# Patient Record
Sex: Male | Born: 1970 | Hispanic: No | Marital: Single
Health system: Western US, Academic
[De-identification: ages and names within clinical notes are randomized; demographics above are authoritative.]

---

## 2020-10-28 IMAGING — MR RM - BACIA (ARTICULACOES SACROILIACAS)
5 of 12 series · 19 of 48 positions shown · non-contrast
Comparison: none

[Series 3: T1 · coronal · 5.0mm · 0.74mm/px · 4 of 24 slices shown (1 of 3)]
[im 1/24]
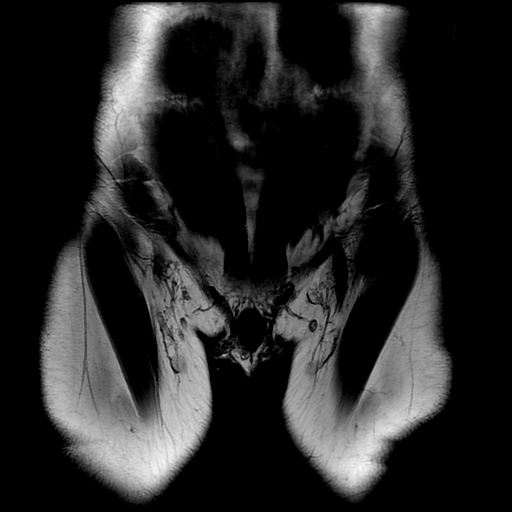
[im 8/24]
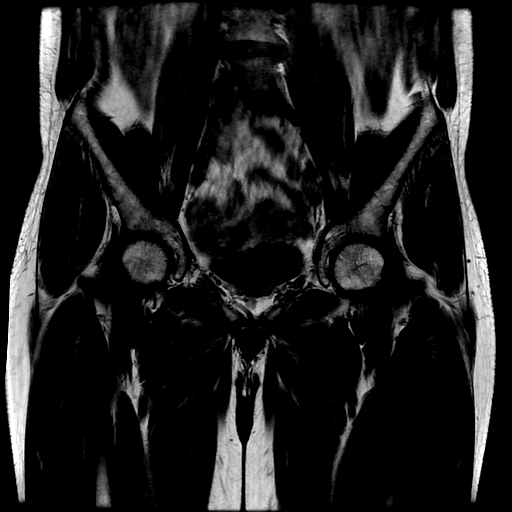
[im 16/24]
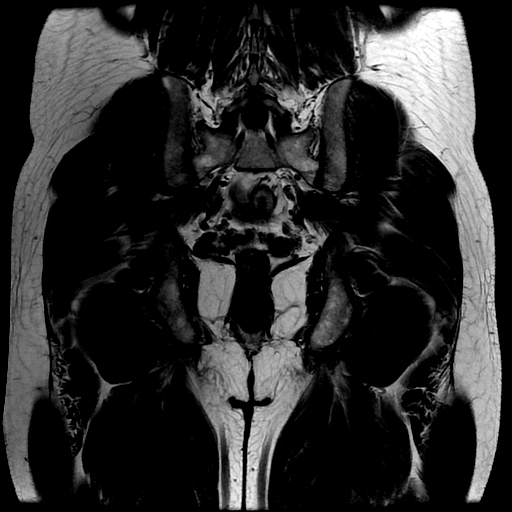
[im 24/24]
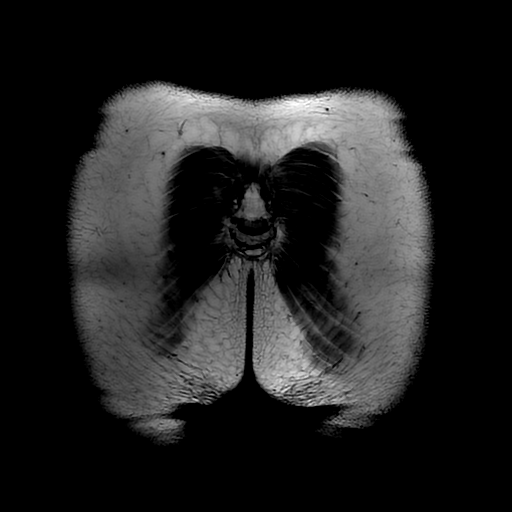

[Series 4: STIR · coronal · 5.0mm · 0.74mm/px · 4 of 24 slices shown (1 of 2)]
[im 1/24]
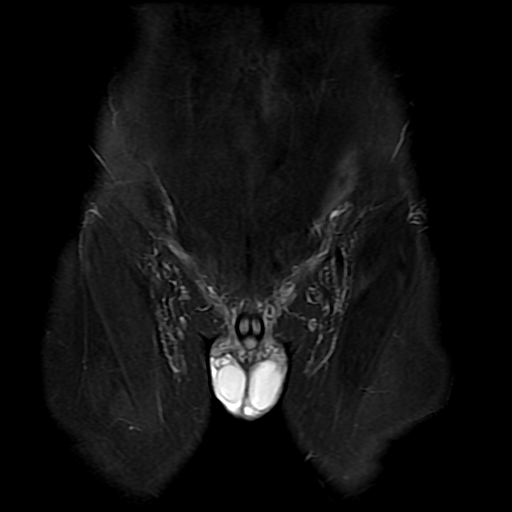
[im 8/24]
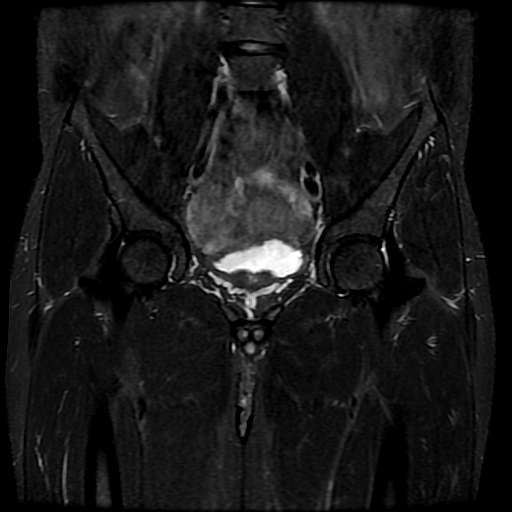
[im 16/24]
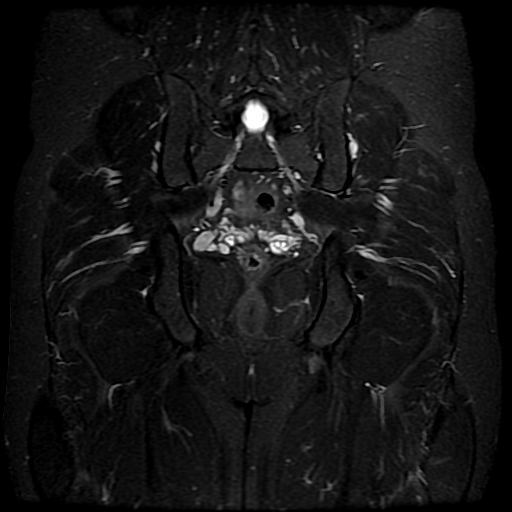
[im 24/24]
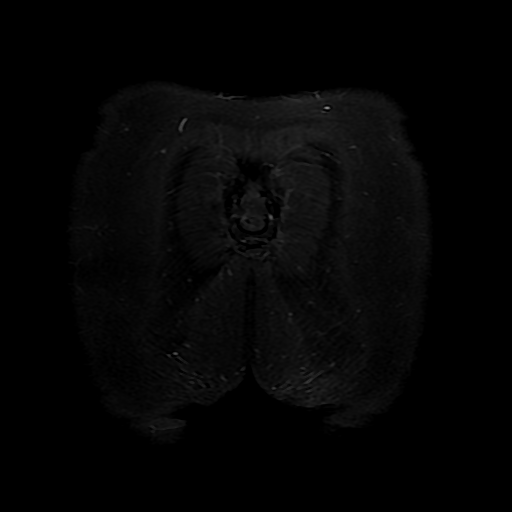

[Series 5: T1 · axial · 5.0mm · 0.72mm/px · z∈[-18,+168]mm · 5 of 32 slices shown (2 of 3)]
[im 1/32]
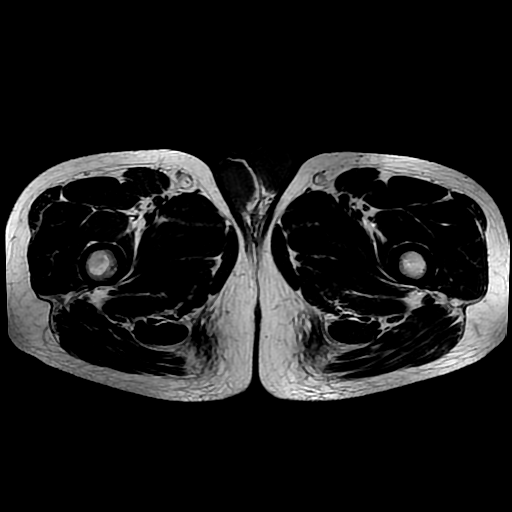
[im 8/32]
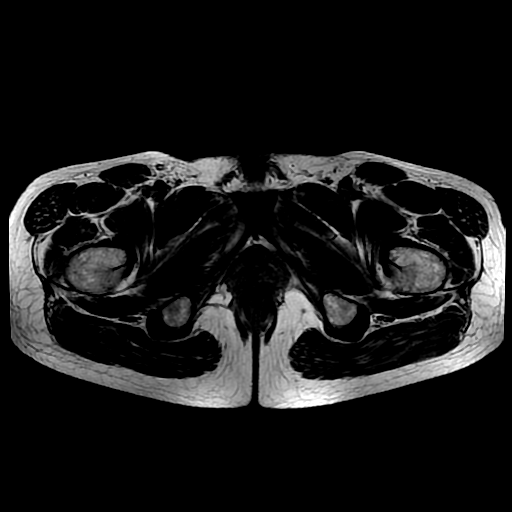
[im 16/32]
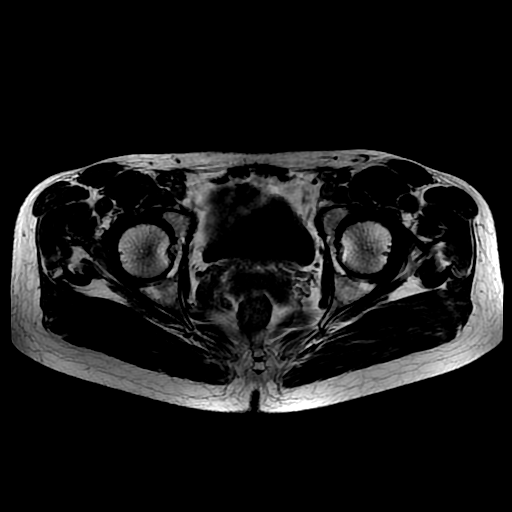
[im 24/32]
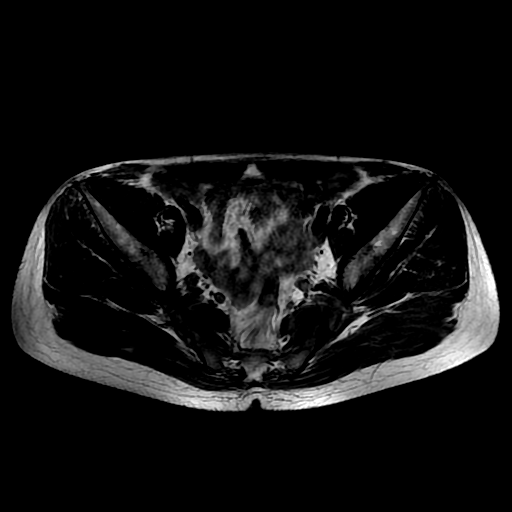
[im 32/32]
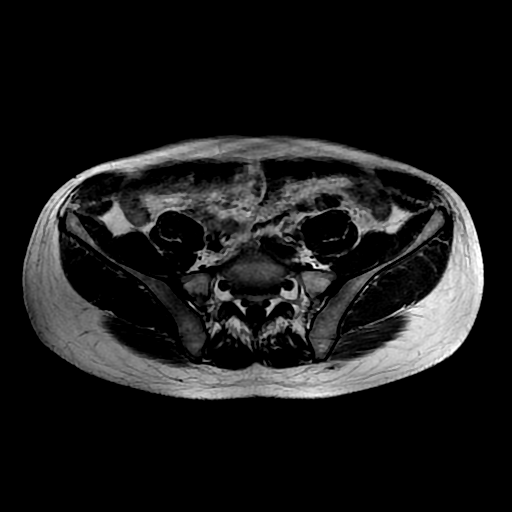

[Series 6: STIR · axial · 5.0mm · 0.72mm/px · 1 of 32 slices shown (2 of 2)]
[im 1/32]
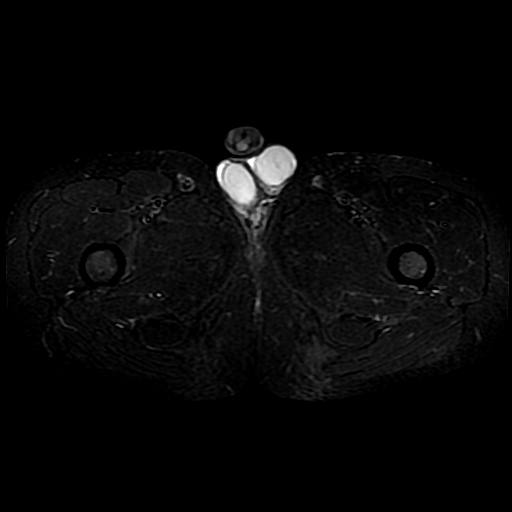

[T1 · axial · 5.0mm · 0.72mm/px · z∈[-18,+168]mm · 5 of 32 slices shown (3 of 3)]
[im 1/32]
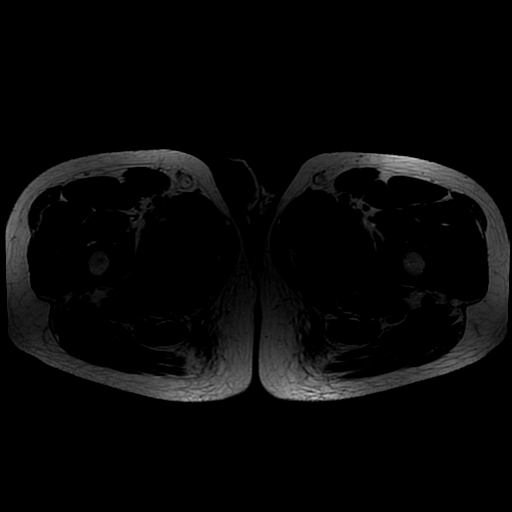
[im 8/32]
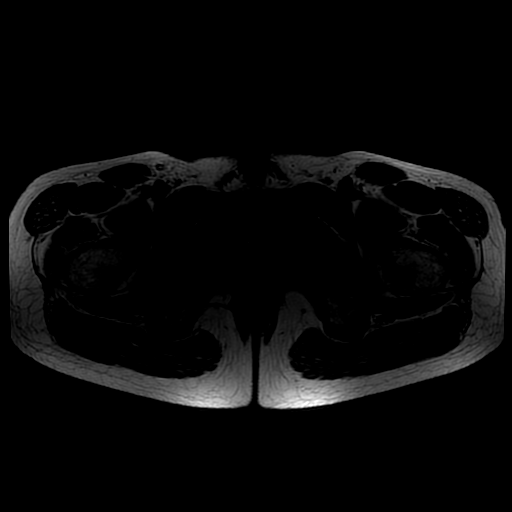
[im 16/32]
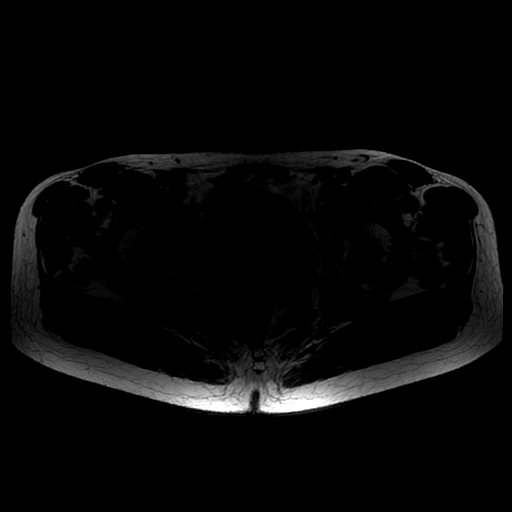
[im 24/32]
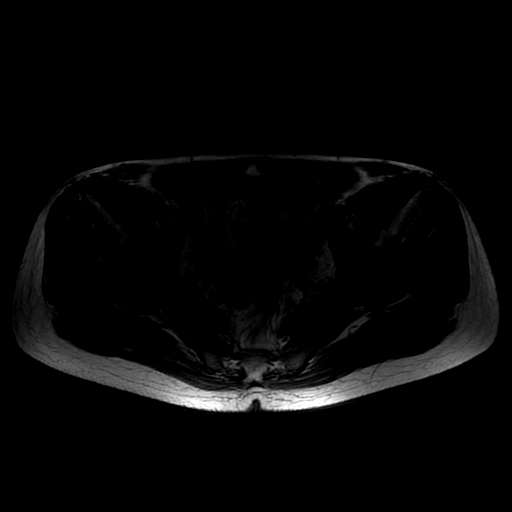
[im 32/32]
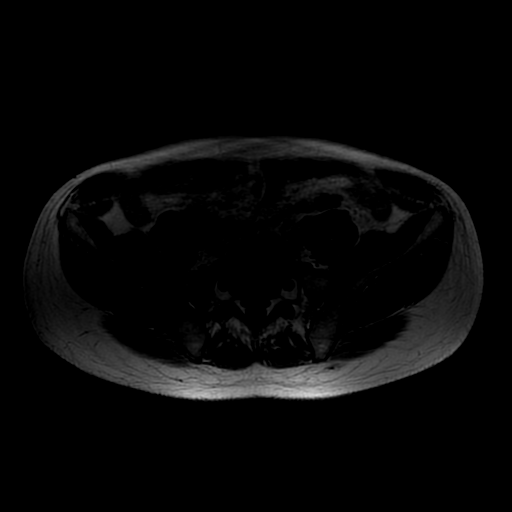

[19 of 48 positions shown; findings below may reference images not displayed]

TÉCNICA DE EXAME:
Realizadas sequências multiplanares ponderadas em T1, STIR e densidade protônica com supressão de 
gordura, sem a administração do contraste venoso.
ANÁLISE:
Articulações femoroacetabulares preservadas.
Articulações sacroilíacas e sínfise púbica preservadas.
Concavidade da transição cabeça / colo femoral preservada, bilateralmente.
Sinais de tendinopatia dos glúteos médios e mínimos.
Demais estruturas musculotendíneas com morfologia e intensidade de sinal normais.
RESSONÂNCIA MAGNÉTICA DA BACIA
Não há atrofia significativa dos ventres musculares avaliados.
Lábios acetabulares sem sinais de roturas, destacando-se a baixa sensibilidade do exame de bacia para a 
avaliação destas estruturas.
Não há evidências de erosões condrais profundas ou derrame articular significativo.
Estruturas ligamentares intrínsecas e extrínsecas dos quadris íntegras.
Feixes neurovasculares sem alterações ao método.
IMPRESSÃO DIAGNÓSTICA:
Tendinopatia dos glúteos mínimos e médios.
  Se  você  não  for  destinatário,  saiba  que  qualquer  divulgação,  cópia,  distribuição  ou  utilização  do  conteúdo  dessas 
informações é proibido e passível de punição dentro da lei.

## 2020-10-28 IMAGING — MR RM - COLUNA LOMBAR
4 of 6 series · 19 of 48 positions shown · non-contrast
Comparison: none

[Series 3: T2 · sagittal · 4.0mm · 0.61mm/px · 5 of 13 slices shown (1 of 3)]
[im 1/13]
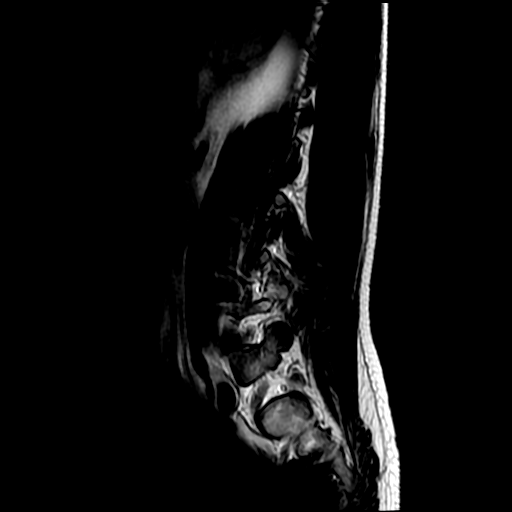
[im 4/13]
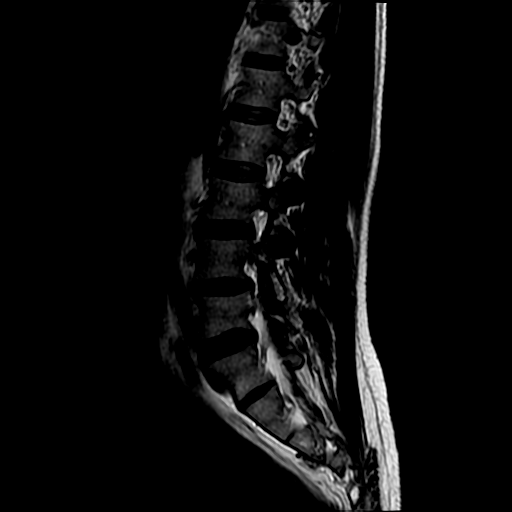
[im 7/13]
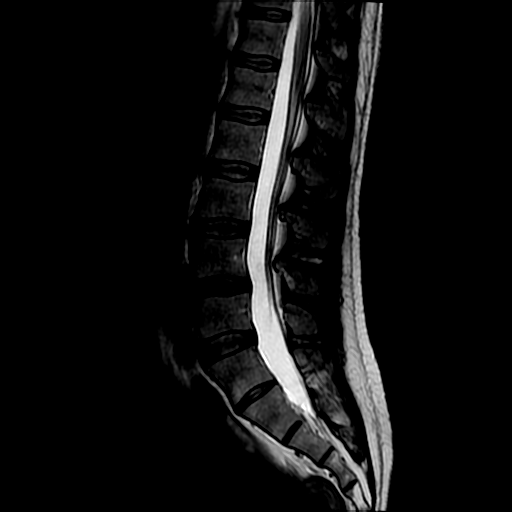
[im 10/13]
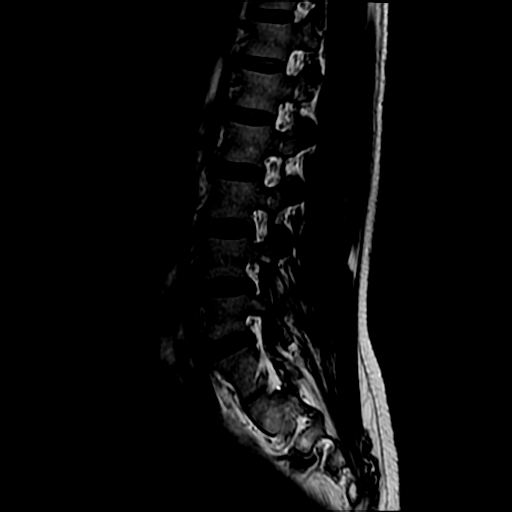
[im 13/13]
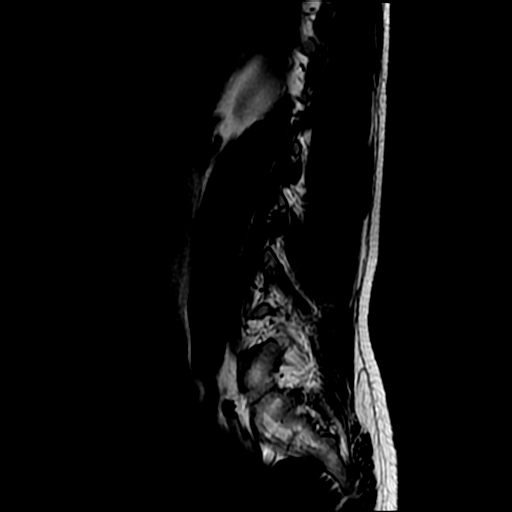

[Series 4: T1 · sagittal · 4.0mm · 0.61mm/px · 3 of 13 slices shown]
[im 3/13]
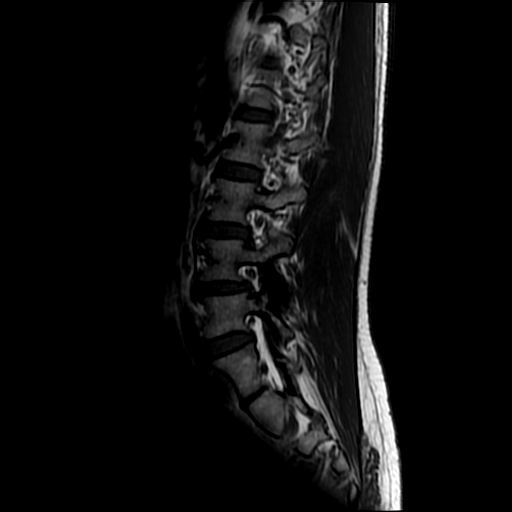
[im 8/13]
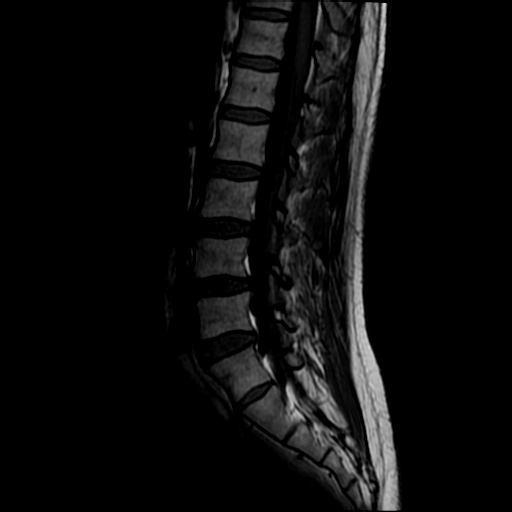
[im 13/13]
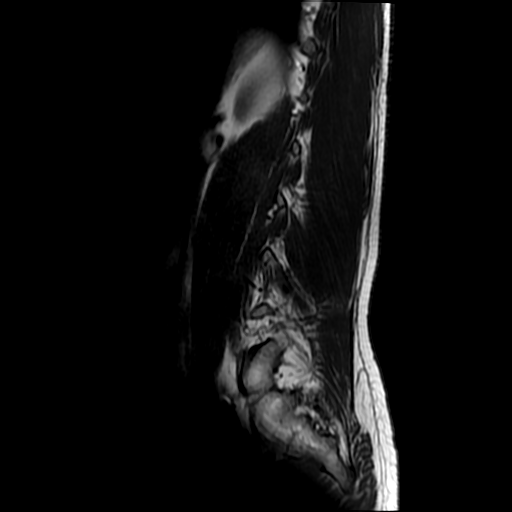

[Series 6: T2 · axial · 4.0mm · 0.43mm/px · z∈[-73,+81]mm · 8 of 42 slices shown (2 of 3)]
[im 3/42]
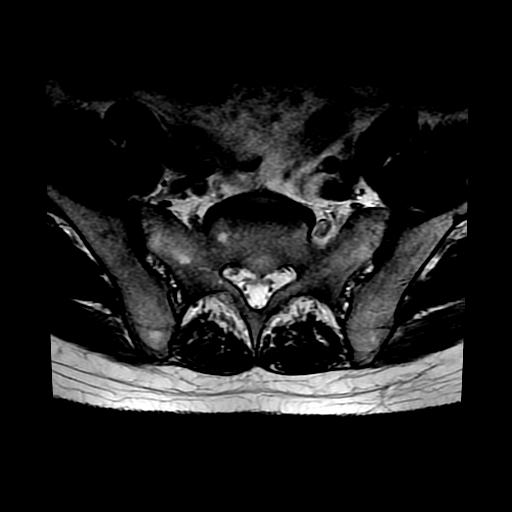
[im 7/42]
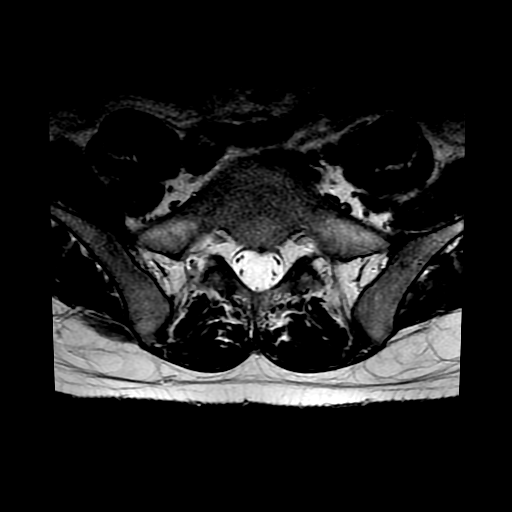
[im 12/42]
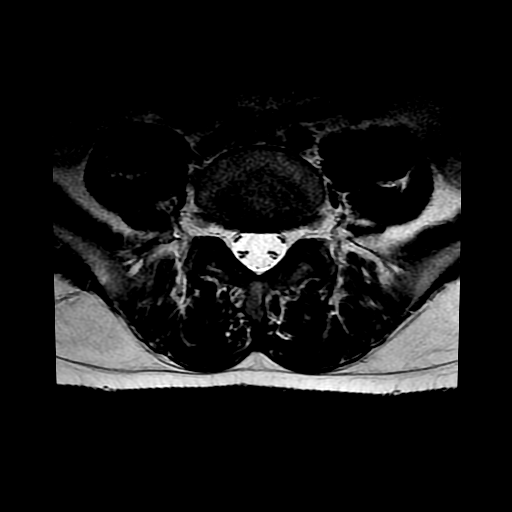
[im 19/42]
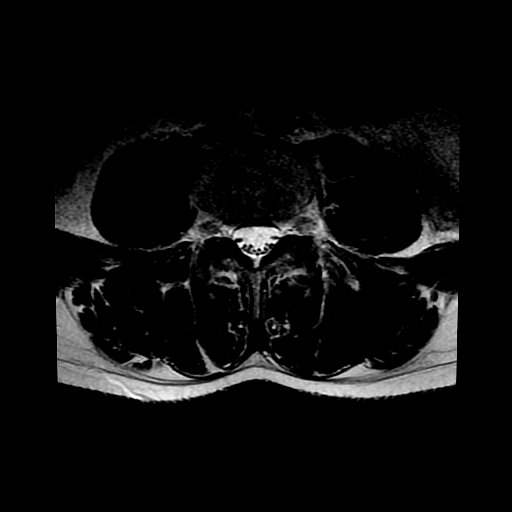
[im 21/42]
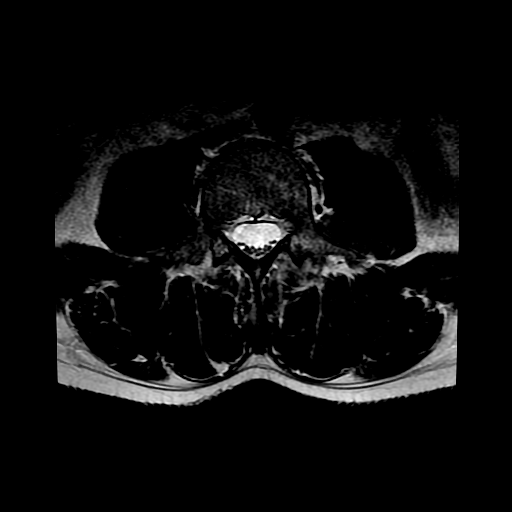
[im 23/42]
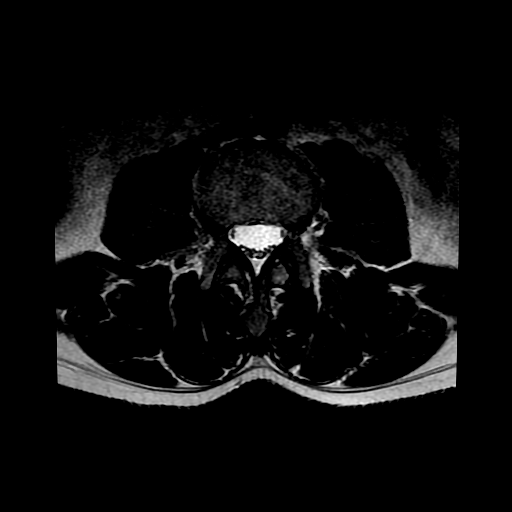
[im 30/42]
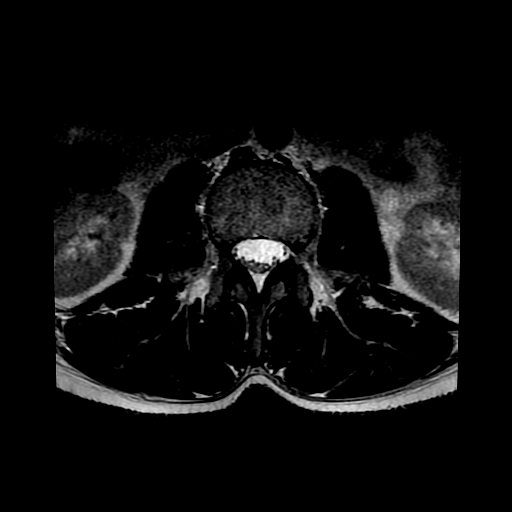
[im 35/42]
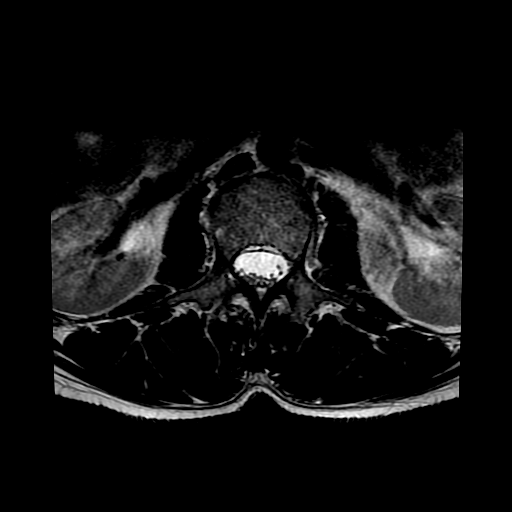

[Series 7: T2 · coronal · 4.5mm · 0.61mm/px · 3 of 13 slices shown (3 of 3)]
[im 3/13]
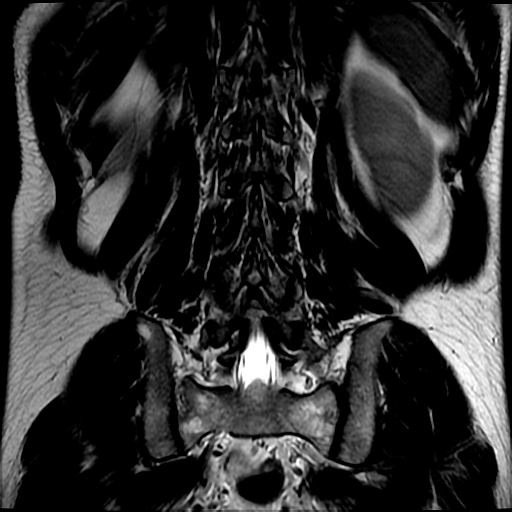
[im 8/13]
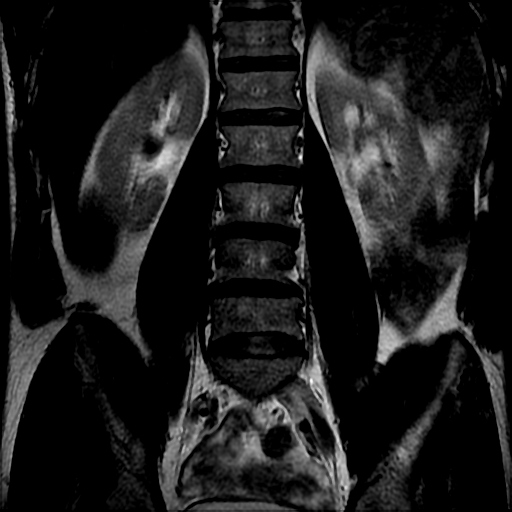
[im 13/13]
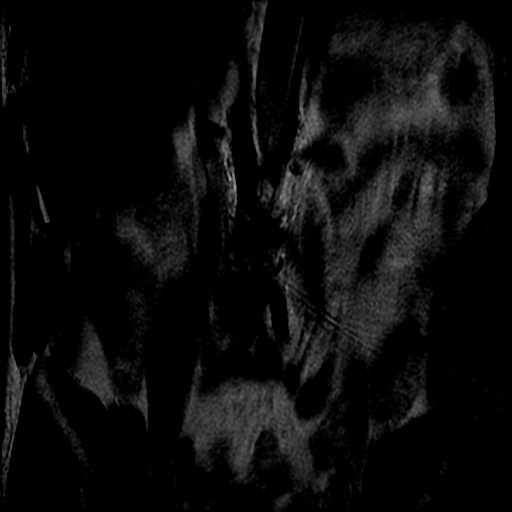

[19 of 48 positions shown; findings below may reference images not displayed]

METODOLOGIA:
Exame realizado com sequências ponderadas em T1 e T2, sem a administração intravenosa do agente de 
contraste paramagnético.
ANÁLISE:
Os corpos vertebrais apresentam altura e alinhamento posterior preservados.
Vértebra pre-sacral transicional denominada como S1 a mérito de relatório
RESSONÂNCIA MAGNÉTICA DA COLUNA LOMBOSSACRA
L4-L5 e L5-S1: Hipo-hidratação discal e abaulamento discal que toca a face ventral do saco dural se 
insinuando para as bases foraminais em íntima relação com as raízes emergentes
O canal vertebral exibe dimensões normais por toda extensão avali Gedan.
Articulações interfacetarias sem alterações significativas.
Demais forames de conjugação livres.
O cone medular é tópico, sendo de calibre e intensidade de sinal normais.
Raízes nervosas da cauda eqüina de morfologia e distribuição anatômicas.
IMPRESSÃO:
Vértebra pre-sacral transicional denominada como S1 a mérito de relatório
L4-L5 e L5-S1: Hipo-hidratação discal e abaulamento discal que toca a face ventral do saco dural se 
insinuando para as bases foraminais em íntima relação com as raízes emergentes
  Se  você  não  for  destinatário,  saiba  que  qualquer  divulgação,  cópia,  distribuição  ou  utilização  do  conteúdo  dessas 
informações é proibido e passível de punição dentro da lei.

## 2022-01-05 ENCOUNTER — Telehealth: Payer: Self-pay

## 2022-01-05 NOTE — Telephone Encounter (Signed)
Patient would like to know if we offer PRP treatment for hands and how much it would cost him. Please assist.

## 2022-01-07 NOTE — Telephone Encounter (Signed)
Returned call to patient, no answer. Left voicemail with call back information.
